# Patient Record
Sex: Male | Born: 2005 | Race: Black or African American | Hispanic: No | Marital: Single | Smoking: Never smoker
Health system: Southern US, Community
[De-identification: ages and names within clinical notes are randomized; demographics above are authoritative.]

---
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
## 2023-01-26 ENCOUNTER — Encounter (HOSPITAL_COMMUNITY): Payer: Self-pay | Admitting: Emergency Medicine

## 2023-01-26 ENCOUNTER — Ambulatory Visit (HOSPITAL_COMMUNITY)
Admission: EM | Admit: 2023-01-26 | Discharge: 2023-01-26 | Disposition: A | Discharge status: Home / Self Care | Payer: Medicaid Other | Attending: Emergency Medicine | Admitting: Emergency Medicine

## 2023-01-26 DIAGNOSIS — J039 Acute tonsillitis, unspecified: Secondary | ICD-10-CM | POA: Diagnosis not present

## 2023-01-26 LAB — POCT RAPID STREP A (OFFICE): Rapid Strep A Screen: NEGATIVE

## 2023-01-26 MED ORDER — AMOXICILLIN 500 MG PO CAPS: 500.0000 mg | ORAL_CAPSULE | Freq: Two times a day (BID) | ORAL | 0 refills | Status: AC

## 2023-01-26 MED ORDER — AMOXICILLIN 500 MG PO CAPS: 500.0000 mg | ORAL_CAPSULE | Freq: Two times a day (BID) | ORAL | 0 refills | Status: DC

## 2023-01-26 NOTE — ED Triage Notes (Signed)
Pt reports having pain on right side tonsillar pain for a few days that has been intermittent. Hasn't taken medications for pain.

## 2023-01-26 NOTE — Discharge Instructions (Signed)
Your rapid strep test today was negative however based on the appearance of your throat you are being prophylactically treated with antibiotics  Take amoxicillin twice a day for the next 10 days, daily will see improvement in about 48 hours and steady progression from there  To be use of salt gargles throat lozenges, warm liquids, teaspoons of honey and over-the-counter clippers septic spray for comfort  May give Tylenol or Motrin every 6 hours as needed for additional comfort  You may follow-up at urgent care as needed

## 2023-01-26 NOTE — ED Provider Notes (Signed)
[facility]    CSN: [id number] Arrival date & time: 01/26/23  1657      History   Chief Complaint Chief Complaint  Patient presents with   Sore Throat    HPI Jacob Fernandez is a 17 y.o. male.   Patient presents for evaluation of right tonsillar pain, right-sided ear pain, subjective fever and nausea without vomiting beginning 2 to 3 days ago.  Symptoms worsening today approximately at 4 PM.  Tolerating food and liquids.  No known sick contacts.  Has attempted use of salt water gargles.   History reviewed. No pertinent past medical history.  There are no problems to display for this patient.   History reviewed. No pertinent surgical history.     Home Medications    Prior to Admission medications   Medication Sig Start Date End Date Taking? Authorizing Provider  benzonatate (TESSALON) 100 MG capsule Take 1 capsule (100 mg total) by mouth 3 (three) times daily as needed for cough. 12/01/21   Zenia Resides, MD    Family History No family history on file.  Social History Social History   Tobacco Use   Smoking status: Never   Smokeless tobacco: Never     Allergies   Patient has no known allergies.   Review of Systems Review of Systems  Constitutional:  Positive for fever. Negative for activity change, appetite change, chills, diaphoresis, fatigue and unexpected weight change.  HENT:  Positive for ear pain and sore throat. Negative for congestion, dental problem, drooling, ear discharge, facial swelling, hearing loss, mouth sores, nosebleeds, postnasal drip, rhinorrhea, sinus pressure, sinus pain, sneezing, tinnitus, trouble swallowing and voice change.   Respiratory:  Negative for apnea, cough, choking, chest tightness, shortness of breath, wheezing and stridor.   Cardiovascular: Negative.   Gastrointestinal:  Positive for nausea. Negative for abdominal distention, abdominal pain, anal bleeding, blood in stool, constipation, diarrhea, rectal pain and  vomiting.  Skin: Negative.   Neurological: Negative.      Physical Exam Triage Vital Signs ED Triage Vitals [01/26/23 1746]  Enc Vitals Group     BP 130/78     Pulse Rate (!) 124     Resp 18     Temp (!) 101.5 F (38.6 C)     Temp Source Oral     SpO2 97 %     Weight      Height      Head Circumference      Peak Flow      Pain Score 3     Pain Loc      Pain Edu?      Excl. in GC?    No data found.  Updated Vital Signs BP 130/78 (BP Location: Left Arm)   Pulse (!) 124   Temp (!) 101.5 F (38.6 C) (Oral)   Resp 18   SpO2 97%   Visual Acuity Right Eye Distance:   Left Eye Distance:   Bilateral Distance:    Right Eye Near:   Left Eye Near:    Bilateral Near:     Physical Exam Constitutional:      Appearance: He is well-developed.  HENT:     Head: Normocephalic.     Right Ear: Tympanic membrane and ear canal normal.     Left Ear: Tympanic membrane and ear canal normal.     Nose: No congestion or rhinorrhea.     Mouth/Throat:     Mouth: Mucous membranes are moist.     Pharynx:  Posterior oropharyngeal erythema present.     Tonsils: Tonsillar exudate present. 2+ on the right. 2+ on the left.  Cardiovascular:     Rate and Rhythm: Normal rate and regular rhythm.     Heart sounds: Normal heart sounds.  Pulmonary:     Effort: Pulmonary effort is normal.     Breath sounds: Normal breath sounds.  Skin:    General: Skin is warm and dry.  Neurological:     Mental Status: He is alert.      UC Treatments / Results  Labs (all labs ordered are listed, but only abnormal results are displayed) Labs Reviewed  POCT RAPID STREP A (OFFICE)    EKG   Radiology No results found.  Procedures Procedures (including critical care time)  Medications Ordered in UC Medications - No data to display  Initial Impression / Assessment and Plan / UC Course  I have reviewed the triage vital signs and the nursing notes.  Pertinent labs & imaging results that were  available during my care of the patient were reviewed by me and considered in my medical decision making (see chart for details).  Acute tonsillitis   Fever of 1 and 1.5 with associated tachycardia noted in triage, patient is not ill-appearing or toxic, stable for outpatient management, erythema, tonsillar adenopathy and exudate noted on exam, strep testing is negative, treating prophylactically, amoxicillin prescribed and discussed supportive care, may follow-up with urgent care as needed  Final Clinical Impressions(s) / UC Diagnoses   Final diagnoses:  None   Discharge Instructions   None    ED Prescriptions   None    PDMP not reviewed this encounter.   Valinda Hoar, [place] 01/26/23 [phone]

## 2023-02-06 ENCOUNTER — Ambulatory Visit (HOSPITAL_COMMUNITY): Payer: Medicaid Other

## 2023-02-06 ENCOUNTER — Ambulatory Visit (HOSPITAL_COMMUNITY)
Admission: EM | Admit: 2023-02-06 | Discharge: 2023-02-06 | Disposition: A | Discharge status: Home / Self Care | Payer: Medicaid Other | Attending: Physician Assistant | Admitting: Physician Assistant

## 2023-02-06 ENCOUNTER — Encounter (HOSPITAL_COMMUNITY): Payer: Self-pay

## 2023-02-06 DIAGNOSIS — J039 Acute tonsillitis, unspecified: Secondary | ICD-10-CM | POA: Diagnosis not present

## 2023-02-06 LAB — POCT RAPID STREP A (OFFICE): Rapid Strep A Screen: NEGATIVE

## 2023-02-06 LAB — POCT MONO SCREEN (KUC): Mono, POC: NEGATIVE

## 2023-02-06 MED ORDER — CEFDINIR 300 MG PO CAPS: 300.0000 mg | ORAL_CAPSULE | Freq: Two times a day (BID) | ORAL | 0 refills | Status: AC

## 2023-02-06 NOTE — Discharge Instructions (Addendum)
Your strep and mono testing were negative.  We will send this off for culture and contact you if we need to change your treatment plan based on your culture results.  Take cefdinir twice daily for 7 days.  Gargle with warm salt water and alternate Tylenol ibuprofen.  Given your current symptoms I do recommend that you follow-up with ENT; call to schedule an appointment.  If anything worsens you have increasing pain, difficulty swallowing, difficulty speaking, fever, nausea, vomiting you should be seen immediately.

## 2023-02-06 NOTE — ED Triage Notes (Signed)
Pt is here for follow-up on right side tonsillar pain. Pt was prescribed a 10-day course of antibiotic and has completed.

## 2023-02-06 NOTE — ED Provider Notes (Signed)
[facility]    CSN: [id number] Arrival date & time: 02/06/23  1048      History   Chief Complaint Chief Complaint  Patient presents with   Sore Throat    HPI Jacob Fernandez is a 17 y.o. male.   Patient presents today with a several week history of intermittent sore throat.  He was seen on 01/26/2023 at which point he was prescribed amoxicillin which she has completed course of medication.  Initially his symptoms were primarily on the left but they have now spread and he is having pain on the right.  He has also noticed recurrent exudate prompting evaluation.  Pain is rated 5 on a 0-10 pain scale, worse with swallowing, no alleviating factors identified.  He did dispose of his toothbrush a few days after starting medication.  Denies any known sick contacts.  Denies any fever, cough, congestion, nausea, vomiting.  He did initially have a fever when symptoms first began 10 days ago but has not had any recurrent fever since then.  He is able to eat and drink despite symptoms.  Denies any swelling to throat, shortness of breath, dysphagia.    History reviewed. No pertinent past medical history.  There are no problems to display for this patient.   History reviewed. No pertinent surgical history.     Home Medications    Prior to Admission medications   Medication Sig Start Date End Date Taking? Authorizing Provider  cefdinir (OMNICEF) 300 MG capsule Take 1 capsule (300 mg total) by mouth 2 (two) times daily. 02/06/23  Yes Shakeela Rabadan K, PA-C  benzonatate (TESSALON) 100 MG capsule Take 1 capsule (100 mg total) by mouth 3 (three) times daily as needed for cough. 12/01/21   Zenia Resides, MD    Family History History reviewed. No pertinent family history.  Social History Social History   Tobacco Use   Smoking status: Never   Smokeless tobacco: Never     Allergies   Patient has no known allergies.   Review of Systems Review of Systems  Constitutional:   Positive for activity change, appetite change, fatigue and fever (Has since resolved).  HENT:  Positive for sore throat and trouble swallowing. Negative for congestion, sinus pressure, sneezing and voice change.   Respiratory:  Negative for cough and shortness of breath.   Cardiovascular:  Negative for chest pain.  Gastrointestinal:  Negative for abdominal pain, diarrhea, nausea and vomiting.     Physical Exam Triage Vital Signs ED Triage Vitals  Encounter Vitals Group     BP 02/06/23 1207 (!) 157/110     Systolic BP Percentile --      Diastolic BP Percentile --      Pulse Rate 02/06/23 1209 82     Resp 02/06/23 1209 16     Temp 02/06/23 1209 98.1 F (36.7 C)     Temp Source 02/06/23 1209 Oral     SpO2 02/06/23 1209 98 %     Weight --      Height --      Head Circumference --      Peak Flow --      Pain Score --      Pain Loc --      Pain Education --      Exclude from Growth Chart --    No data found.  Updated Vital Signs BP (!) 157/110   Pulse 82   Temp 98.1 F (36.7 C) (Oral)   Resp 16  SpO2 98%   Visual Acuity Right Eye Distance:   Left Eye Distance:   Bilateral Distance:    Right Eye Near:   Left Eye Near:    Bilateral Near:     Physical Exam Vitals reviewed.  Constitutional:      General: He is awake.     Appearance: Normal appearance. He is well-developed. He is not ill-appearing.     Comments: Very pleasant male appears stated age in no acute distress sitting actively in exam room  HENT:     Head: Normocephalic and atraumatic.     Right Ear: Tympanic membrane, ear canal and external ear normal. Tympanic membrane is not erythematous or bulging.     Left Ear: Tympanic membrane, ear canal and external ear normal. Tympanic membrane is not erythematous or bulging.     Nose: Nose normal.     Mouth/Throat:     Pharynx: Uvula midline. Posterior oropharyngeal erythema present. No oropharyngeal exudate or uvula swelling.     Tonsils: Tonsillar exudate  present. No tonsillar abscesses. 2+ on the right. 2+ on the left.  Cardiovascular:     Rate and Rhythm: Normal rate and regular rhythm.     Heart sounds: Normal heart sounds, S1 normal and S2 normal. No murmur heard. Pulmonary:     Effort: Pulmonary effort is normal. No accessory muscle usage or respiratory distress.     Breath sounds: Normal breath sounds. No stridor. No wheezing, rhonchi or rales.     Comments: Clear to auscultation bilaterally Lymphadenopathy:     Head:     Right side of head: No submental, submandibular or tonsillar adenopathy.     Left side of head: No submental, submandibular or tonsillar adenopathy.     Cervical: No cervical adenopathy.  Neurological:     Mental Status: He is alert.  Psychiatric:        Behavior: Behavior is cooperative.      UC Treatments / Results  Labs (all labs ordered are listed, but only abnormal results are displayed) Labs Reviewed  CULTURE, GROUP A STREP [facility])  POCT RAPID STREP A (OFFICE)  POCT MONO SCREEN [facility])    EKG   Radiology No results found.  Procedures Procedures (including critical care time)  Medications Ordered in UC Medications - No data to display  Initial Impression / Assessment and Plan / UC Course  I have reviewed the triage vital signs and the nursing notes.  Pertinent labs & imaging results that were available during my care of the patient were reviewed by me and considered in my medical decision making (see chart for details).     Patient is well-appearing, afebrile, nontoxic, nontachycardic.  Strep testing was repeated and was negative.  This was sent off for culture.  Mono testing was obtained and was negative as well.  Given appearance of tonsils will cover with broad-spectrum antibiotics.  He was started on cefdinir twice daily for 7 days.  Recommended gargling warm salt water and using Tylenol and ibuprofen for additional symptom relief.  Also discussed the importance of oral hygiene.  Given his  recurrent symptoms despite appropriate treatment I did recommend that he follow-up with ENT for further evaluation and management.  He was given contact information for local provider with instruction to call to schedule appointment.  Discussed that if he has any worsening or changing symptoms including persistent severe pain, dysphagia, muffled voice, swelling of his throat, fever, nausea, vomiting he needs to be seen emergently.  Strict return precautions  given.  Final Clinical Impressions(s) / UC Diagnoses   Final diagnoses:  Acute tonsillitis, unspecified etiology     Discharge Instructions      Your strep and mono testing were negative.  We will send this off for culture and contact you if we need to change your treatment plan based on your culture results.  Take cefdinir twice daily for 7 days.  Gargle with warm salt water and alternate Tylenol ibuprofen.  Given your current symptoms I do recommend that you follow-up with ENT; call to schedule an appointment.  If anything worsens you have increasing pain, difficulty swallowing, difficulty speaking, fever, nausea, vomiting you should be seen immediately.     ED Prescriptions     Medication Sig Dispense Auth. Provider   cefdinir (OMNICEF) 300 MG capsule Take 1 capsule (300 mg total) by mouth 2 (two) times daily. 14 capsule Less Woolsey K, PA-C      PDMP not reviewed this encounter.   Jeani Hawking, PA-C 02/06/23 1249

## 2023-02-08 LAB — CULTURE, GROUP A STREP (THRC)

## 2023-08-15 ENCOUNTER — Telehealth: Payer: BLUE CROSS/BLUE SHIELD | Admitting: Physician Assistant

## 2023-08-15 DIAGNOSIS — R197 Diarrhea, unspecified: Secondary | ICD-10-CM

## 2023-08-15 DIAGNOSIS — K529 Noninfective gastroenteritis and colitis, unspecified: Secondary | ICD-10-CM

## 2023-08-15 MED ORDER — LOPERAMIDE HCL 2 MG PO TABS: 2.0000 mg | ORAL_TABLET | Freq: Four times a day (QID) | ORAL | 0 refills | Status: DC | PRN

## 2023-08-15 NOTE — Patient Instructions (Signed)
  Correy Tera Mater, thank you for joining Gilberto Better, PA-C for today's virtual visit.  While this provider is not your primary care provider (PCP), if your PCP is located in our provider database this encounter information will be shared with them immediately following your visit.   A [facility] MyChart account gives you access to today's visit and all your visits, tests, and labs performed at [facility] " click here if you don't have a [facility] MyChart account or go to [email or web address]  Consent: (Patient) Jacob Fernandez provided verbal consent for this virtual visit at the beginning of the encounter.  Current Medications:  Current Outpatient Medications:    loperamide (IMODIUM A-D) 2 MG tablet, Take 1 tablet (2 mg total) by mouth 4 (four) times daily as needed for diarrhea or loose stools., Disp: 30 tablet, Rfl: 0   benzonatate (TESSALON) 100 MG capsule, Take 1 capsule (100 mg total) by mouth 3 (three) times daily as needed for cough., Disp: 21 capsule, Rfl: 0   cefdinir (OMNICEF) 300 MG capsule, Take 1 capsule (300 mg total) by mouth 2 (two) times daily., Disp: 14 capsule, Rfl: 0   Medications ordered in this encounter:  Meds ordered this encounter  Medications   loperamide (IMODIUM A-D) 2 MG tablet    Sig: Take 1 tablet (2 mg total) by mouth 4 (four) times daily as needed for diarrhea or loose stools.    Dispense:  30 tablet    Refill:  0    Supervising Provider:   Merrilee Jansky [[id number]]     *If you need refills on other medications prior to your next appointment, please contact your pharmacy*  Follow-Up: Call back or seek an in-person evaluation if the symptoms worsen or if the condition fails to improve as anticipated.  [facility] [phone]  Other Instructions Stay well hydrated. REST Do not eat undercooked or uncooked foods/meats Take Imodium for diarrhea. Continue to watch for worsening symptoms. Schedule an appointment if  symptoms don't improve.   If you have been instructed to have an in-person evaluation today at a local Urgent Care facility, please use the link below. It will take you to a list of all of our available [facility], including address, phone number and hours of operation. Please do not delay care.  [facility]  If you or a family member do not have a primary care provider, use the link below to schedule a visit and establish care. When you choose a [facility] primary care physician or advanced practice provider, you gain a long-term partner in health. Find a Primary Care Provider  Learn more about [facility]'s in-office and virtual care options: [facility] - Get Care Now

## 2023-08-15 NOTE — Progress Notes (Signed)
Virtual Visit Consent   Jacob Fernandez, you are scheduled for a virtual visit with a [facility] provider today. Just as with appointments in the office, your consent must be obtained to participate. Your consent will be active for this visit and any virtual visit you may have with one of our providers in the next 365 days. If you have a MyChart account, a copy of this consent can be sent to you electronically.  As this is a virtual visit, video technology does not allow for your provider to perform a traditional examination. This may limit your provider's ability to fully assess your condition. If your provider identifies any concerns that need to be evaluated in person or the need to arrange testing (such as labs, EKG, etc.), we will make arrangements to do so. Although advances in technology are sophisticated, we cannot ensure that it will always work on either your end or our end. If the connection with a video visit is poor, the visit may have to be switched to a telephone visit. With either a video or telephone visit, we are not always able to ensure that we have a secure connection.  By engaging in this virtual visit, you consent to the provision of healthcare and authorize for your insurance to be billed (if applicable) for the services provided during this visit. Depending on your insurance coverage, you may receive a charge related to this service.  I need to obtain your verbal consent now. Are you willing to proceed with your visit today? Jacob Fernandez has provided verbal consent on 08/15/2023 for a virtual visit (video or telephone). Gilberto Better, [place]  Date: 08/15/2023 4:50 PM  Virtual Visit via Video Note   I, Gilberto Better, connected with  Jacob Fernandez  ([id number], [birth year]) on 08/15/23 at  4:45 PM EST by a video-enabled telemedicine application and verified that I am speaking with the correct person using two identifiers.  Location: Patient: Virtual Visit Location Patient:  Home Provider: Virtual Visit Location Provider: Home Office   I discussed the limitations of evaluation and management by telemedicine and the availability of in person appointments. The patient expressed understanding and agreed to proceed.    History of Present Illness: Jacob Fernandez is a 18 y.o. who identifies as a male who was assigned male at birth, and is being seen today for nausea, vomiting, diarrhea.Marland Kitchen  HPI: 18 y/o M presents via video visit for abdominal pain, nausea, vomiting, and diarrhea x 1 day. Known exposure through his girlfriend with similar symptoms. Today vomiting is resolved. Diarrhea continues without blood or mucous. He is trying to stay well hydrated. Denies fever. No recent international travel.   Abdominal Pain    Problems: There are no active problems to display for this patient.   Allergies: No Known Allergies Medications:  Current Outpatient Medications:    loperamide (IMODIUM A-D) 2 MG tablet, Take 1 tablet (2 mg total) by mouth 4 (four) times daily as needed for diarrhea or loose stools., Disp: 30 tablet, Rfl: 0   benzonatate (TESSALON) 100 MG capsule, Take 1 capsule (100 mg total) by mouth 3 (three) times daily as needed for cough., Disp: 21 capsule, Rfl: 0   cefdinir (OMNICEF) 300 MG capsule, Take 1 capsule (300 mg total) by mouth 2 (two) times daily., Disp: 14 capsule, Rfl: 0  Observations/Objective: Patient is well-developed, well-nourished in no acute distress.  Resting comfortably  at home.  Head is normocephalic, atraumatic.  No labored breathing.  Speech is clear and coherent  with logical content.  Patient is alert and oriented at baseline.    Assessment and Plan: 1. Gastroenteritis (Primary)  2. Diarrhea, unspecified type - loperamide (IMODIUM A-D) 2 MG tablet; Take 1 tablet (2 mg total) by mouth 4 (four) times daily as needed for diarrhea or loose stools.  Dispense: 30 tablet; Refill: 0  Stay well hydrated. REST Do not eat undercooked or  uncooked foods/meats Take Imodium for diarrhea. Continue to watch for worsening symptoms. Pt requested a school note. Schedule an appointment if symptoms don't improve. Pt verbalized understanding and in agreement.    Follow Up Instructions: I discussed the assessment and treatment plan with the patient. The patient was provided an opportunity to ask questions and all were answered. The patient agreed with the plan and demonstrated an understanding of the instructions.  A copy of instructions were sent to the patient via MyChart unless otherwise noted below.   Patient has requested to receive PHI (AVS, Work Notes, etc) pertaining to this video visit through e-mail as they are currently without active MyChart. They have voiced understand that email is not considered secure and their health information could be viewed by someone other than the patient.   The patient was advised to call back or seek an in-person evaluation if the symptoms worsen or if the condition fails to improve as anticipated.    Gilberto Better, PA-C

## 2023-08-16 MED ORDER — LOPERAMIDE HCL 2 MG PO TABS: 2.0000 mg | ORAL_TABLET | Freq: Four times a day (QID) | ORAL | 0 refills | Status: AC | PRN

## 2023-08-16 NOTE — Addendum Note (Signed)
Addended by: Margaretann Loveless on: 08/16/2023 12:26 PM   Modules accepted: Orders

## 2023-08-22 IMAGING — DX DG CHEST 2V
2 series · 2 of 2 positions shown · non-contrast
Comparison: AP chest 08/21/2003

CLINICAL DATA: Cough and chest congestion.

EXAM:
CHEST - 2 VIEW

[chest pa]
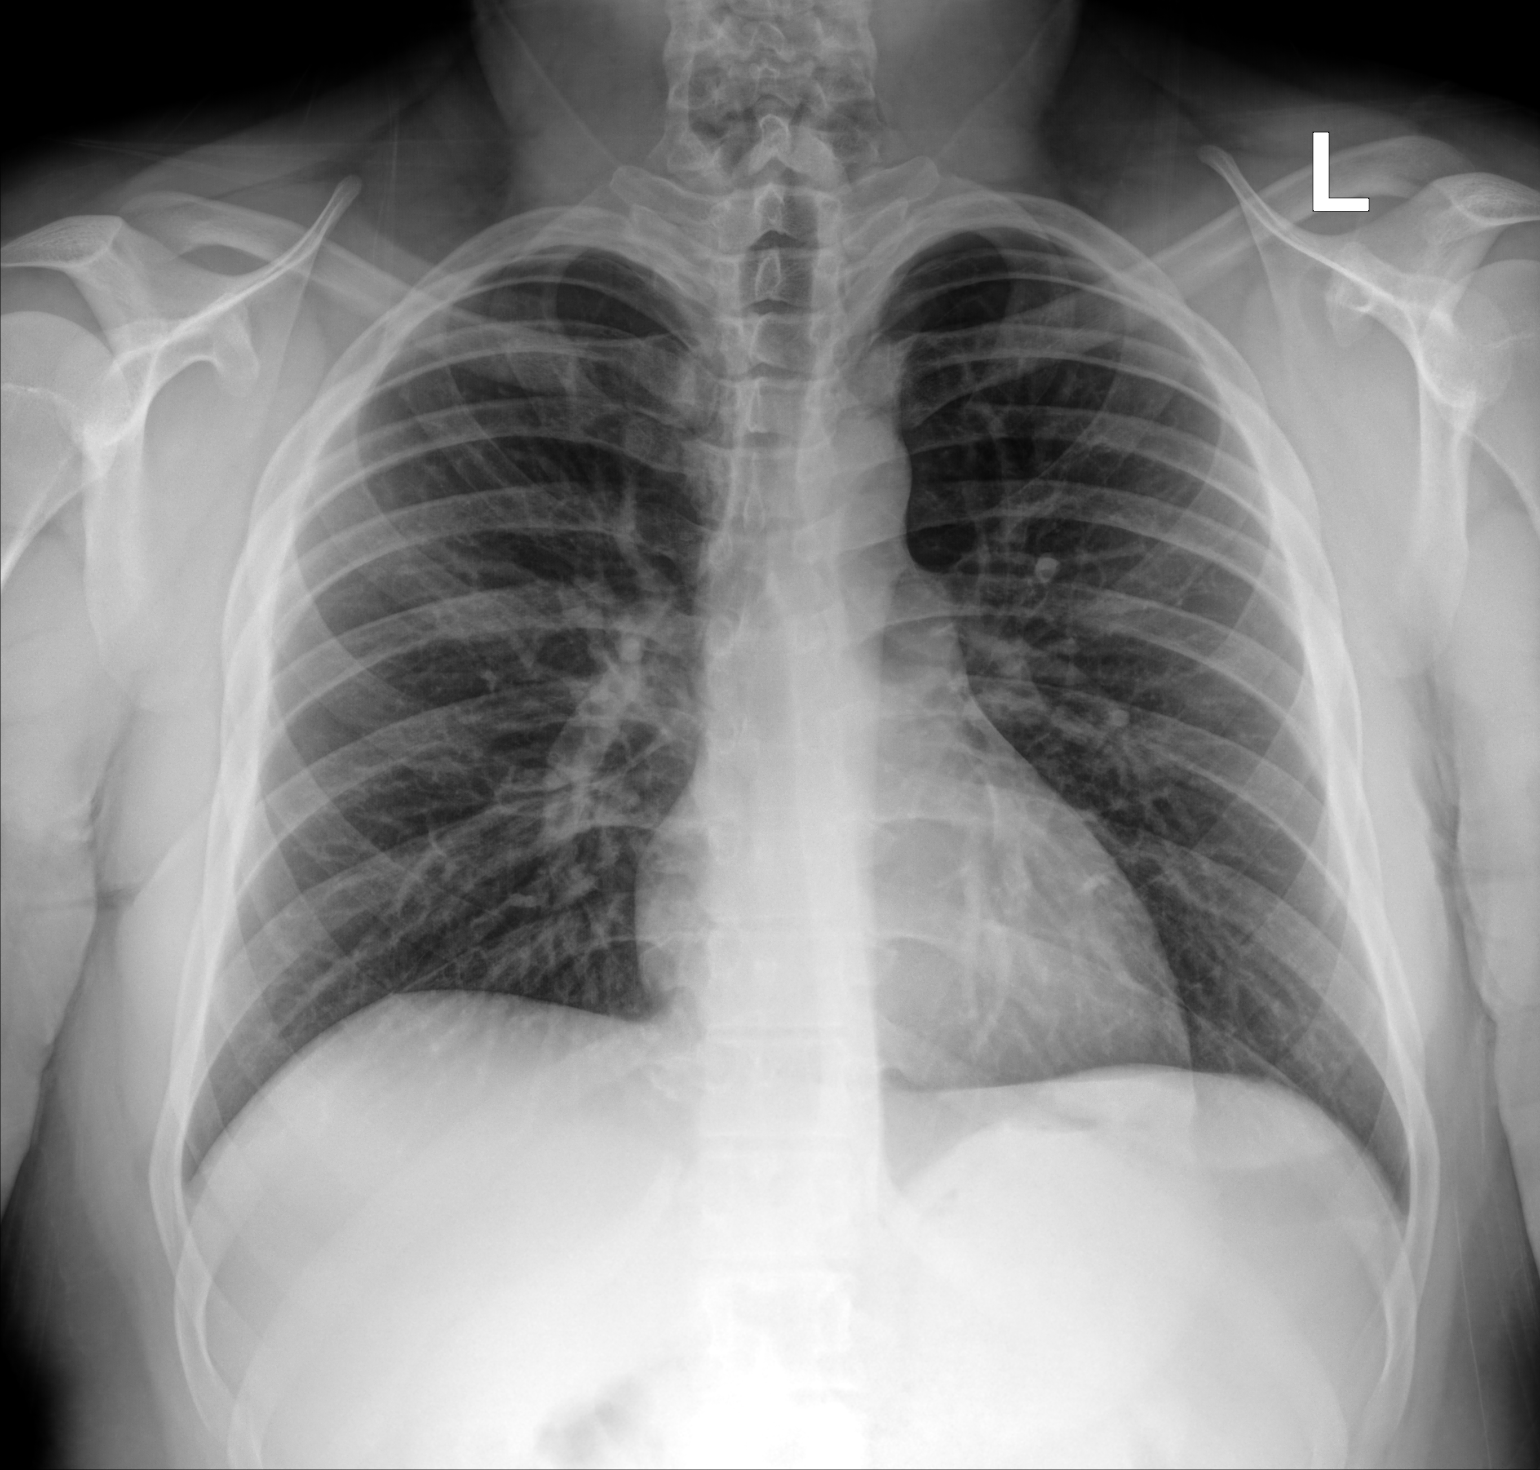

[chest lat]
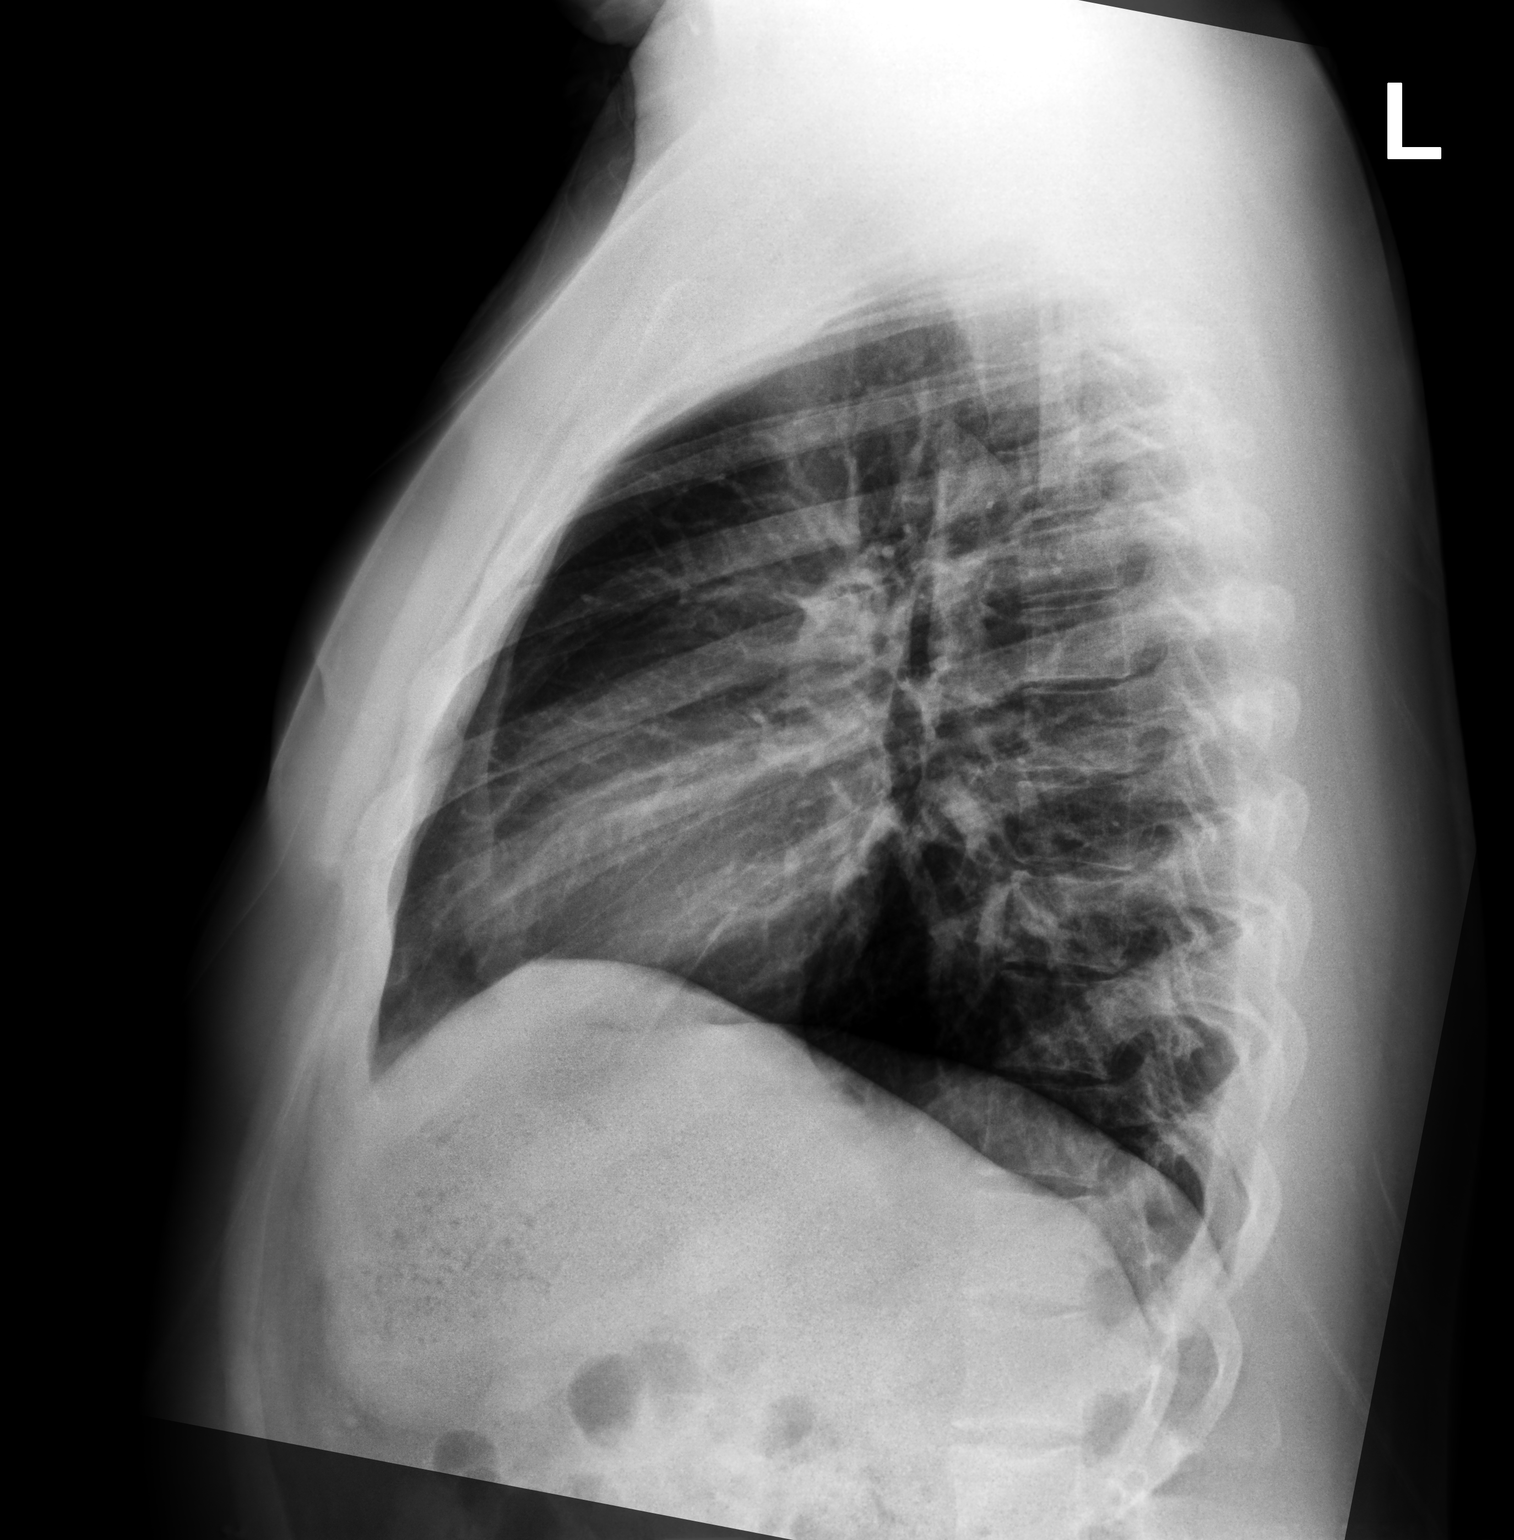

[2 of 2 positions shown; findings below may reference images not displayed]

FINDINGS: Cardiac silhouette and mediastinal contours are within normal
limits. The lungs are clear. No pleural effusion or pneumothorax. No
acute skeletal abnormality.
IMPRESSION: No active cardiopulmonary disease.
